# Patient Record
Sex: Male | Born: 1995 | Race: White | Hispanic: No | Smoking: Never smoker
Health system: Southern US, Community
[De-identification: ages and names within clinical notes are randomized; demographics above are authoritative.]

## PROBLEM LIST (undated history)

## (undated) DIAGNOSIS — A6000 Herpesviral infection of urogenital system, unspecified: Secondary | ICD-10-CM

## (undated) HISTORY — DX: Herpesviral infection of urogenital system, unspecified: A60.00

---

## 2017-02-07 ENCOUNTER — Encounter (HOSPITAL_COMMUNITY): Payer: Self-pay | Admitting: Emergency Medicine

## 2017-02-07 ENCOUNTER — Ambulatory Visit (HOSPITAL_COMMUNITY)
Admission: EM | Admit: 2017-02-07 | Discharge: 2017-02-07 | Disposition: A | Discharge status: Home / Self Care | Payer: No Typology Code available for payment source | Attending: Internal Medicine | Admitting: Internal Medicine

## 2017-02-07 DIAGNOSIS — R509 Fever, unspecified: Secondary | ICD-10-CM

## 2017-02-07 DIAGNOSIS — J029 Acute pharyngitis, unspecified: Secondary | ICD-10-CM

## 2017-02-07 LAB — POCT RAPID STREP A: Streptococcus, Group A Screen (Direct): NEGATIVE

## 2017-02-07 MED ORDER — ACETAMINOPHEN 325 MG PO TABS
650.0000 mg | ORAL_TABLET | Freq: Once | ORAL | Status: AC
  Administered 2017-02-07: 650 mg via ORAL

## 2017-02-07 MED ORDER — ACETAMINOPHEN 325 MG PO TABS
ORAL_TABLET | ORAL | Status: AC
  Filled 2017-02-07: qty 2

## 2017-02-07 MED ORDER — LIDOCAINE HCL (PF) 1 % IJ SOLN
INTRAMUSCULAR | Status: AC
  Filled 2017-02-07: qty 2

## 2017-02-07 MED ORDER — METHYLPREDNISOLONE SODIUM SUCC 125 MG IJ SOLR
125.0000 mg | Freq: Once | INTRAMUSCULAR | Status: AC
  Administered 2017-02-07: 125 mg via INTRAMUSCULAR

## 2017-02-07 MED ORDER — CEFTRIAXONE SODIUM 1 G IJ SOLR
INTRAMUSCULAR | Status: AC
  Filled 2017-02-07: qty 10

## 2017-02-07 MED ORDER — LIDOCAINE VISCOUS 2 % MT SOLN: 10.0000 mL | OROMUCOSAL | 0 refills | Status: DC | PRN

## 2017-02-07 MED ORDER — CEFTRIAXONE SODIUM 1 G IJ SOLR
1.0000 g | Freq: Once | INTRAMUSCULAR | Status: AC
  Administered 2017-02-07: 1 g via INTRAMUSCULAR

## 2017-02-07 MED ORDER — AMOXICILLIN-POT CLAVULANATE 875-125 MG PO TABS: 1.0000 | ORAL_TABLET | Freq: Two times a day (BID) | ORAL | 0 refills | Status: DC

## 2017-02-07 MED ORDER — METHYLPREDNISOLONE SODIUM SUCC 125 MG IJ SOLR
INTRAMUSCULAR | Status: AC
  Filled 2017-02-07: qty 2

## 2017-02-07 NOTE — Discharge Instructions (Signed)
If having difficulty swallowing secretions or if not better then go to ED.

## 2017-02-07 NOTE — ED Notes (Signed)
Patient requesting to wait a few minutes prior to leaving, provided gatorade

## 2017-02-07 NOTE — ED Triage Notes (Signed)
The patient presented to the [facility] with a complaint of a sore throat x 4 days. 

## 2017-02-07 NOTE — ED Provider Notes (Signed)
  [facility]   [id number] 02/07/17 Arrival Time: 1442  ASSESSMENT & PLAN:  1. Pharyngitis, unspecified etiology   2. Sore throat     Meds ordered this encounter  Medications  . acetaminophen (TYLENOL) tablet 650 mg  . cefTRIAXone (ROCEPHIN) injection 1 g  . methylPREDNISolone sodium succinate (SOLU-MEDROL) 125 mg/2 mL injection 125 mg  . amoxicillin-clavulanate (AUGMENTIN) 875-125 MG tablet    Sig: Take 1 tablet by mouth 2 (two) times daily.    Dispense:  20 tablet    Refill:  0    Order Specific Question:   Supervising Provider    Answer:   Eustace Moore [[id number]]  . lidocaine (XYLOCAINE) 2 % solution    Sig: Use as directed 10 mLs in the mouth or throat as needed for mouth pain.    Dispense:  100 mL    Refill:  0    Order Specific Question:   Supervising Provider    Answer:   Eustace Moore [[id number]]    Reviewed expectations re: course of current medical issues. Questions answered. Outlined signs and symptoms indicating need for more acute intervention. Patient verbalized understanding. After Visit Summary given.  Explained to patient if he is not getting any better or worsens then go straight to ED. Explained if unable to swallow secretions or develops any stridor then go to ED. SUBJECTIVE:  Robert Whitney is a 21 y.o. male who presents with complaint of sore throat and fever for last 4 days.    ROS: As per HPI.   OBJECTIVE:  Vitals:   02/07/17 1500  BP: 111/62  Pulse: (!) 129  Resp: 18  Temp: (!) 100.7 F (38.2 C)  TempSrc: Oral  SpO2: 96%     General appearance: alert; no distress Eyes: PERRLA; EOMI; conjunctiva normal HENT: normocephalic; atraumatic; TMs normal; nasal mucosa normal; Erythematous tonsils that are touching and with exudates. Swollen and erythematous uvula. Neck: LAD anterior Lungs: clear to auscultation bilaterally Heart: regular rate and rhythm Neurologic: normal gait; normal symmetric reflexes Psychological: alert and  cooperative; normal mood and affect  History reviewed. No pertinent past medical history.   has no past medical history on file.  Results for orders placed or performed during the hospital encounter of 02/07/17  POCT rapid strep A [facility])  Result Value Ref Range   Streptococcus, Group A Screen (Direct) NEGATIVE NEGATIVE    Labs Reviewed  POCT RAPID STREP A    Imaging: No results found.  Allergies  Allergen Reactions  . Nyquil Multi-Symptom [Pseudoeph-Doxylamine-Dm-Apap] Other (See Comments)    weakness    History reviewed. No pertinent family history. History reviewed. No pertinent surgical history.       Deatra Canter, [place] 02/07/17 [phone]

## 2017-02-08 ENCOUNTER — Encounter (HOSPITAL_COMMUNITY): Payer: Self-pay | Admitting: Emergency Medicine

## 2017-02-08 ENCOUNTER — Emergency Department (HOSPITAL_COMMUNITY): Payer: No Typology Code available for payment source

## 2017-02-08 ENCOUNTER — Emergency Department (HOSPITAL_COMMUNITY)
Admission: EM | Admit: 2017-02-08 | Discharge: 2017-02-08 | Disposition: A | Discharge status: Home / Self Care | Payer: No Typology Code available for payment source | Attending: Emergency Medicine | Admitting: Emergency Medicine

## 2017-02-08 DIAGNOSIS — J028 Acute pharyngitis due to other specified organisms: Secondary | ICD-10-CM | POA: Insufficient documentation

## 2017-02-08 DIAGNOSIS — R509 Fever, unspecified: Secondary | ICD-10-CM | POA: Insufficient documentation

## 2017-02-08 DIAGNOSIS — B9689 Other specified bacterial agents as the cause of diseases classified elsewhere: Secondary | ICD-10-CM | POA: Insufficient documentation

## 2017-02-08 DIAGNOSIS — J029 Acute pharyngitis, unspecified: Secondary | ICD-10-CM | POA: Diagnosis present

## 2017-02-08 LAB — CBC WITH DIFFERENTIAL/PLATELET
BASOS ABS: 0 10*3/uL (ref 0.0–0.1)
Basophils Relative: 0 %
EOS PCT: 0 %
Eosinophils Absolute: 0 10*3/uL (ref 0.0–0.7)
HCT: 44.8 % (ref 39.0–52.0)
Hemoglobin: 15.3 g/dL (ref 13.0–17.0)
LYMPHS ABS: 1.1 10*3/uL (ref 0.7–4.0)
Lymphocytes Relative: 9 %
MCH: 29.6 pg (ref 26.0–34.0)
MCHC: 34.2 g/dL (ref 30.0–36.0)
MCV: 86.7 fL (ref 78.0–100.0)
MONO ABS: 2 10*3/uL — AB (ref 0.1–1.0)
Monocytes Relative: 17 %
NEUTROS PCT: 74 %
Neutro Abs: 8.8 10*3/uL — ABNORMAL HIGH (ref 1.7–7.7)
PLATELETS: 212 10*3/uL (ref 150–400)
RBC: 5.17 MIL/uL (ref 4.22–5.81)
RDW: 12.5 % (ref 11.5–15.5)
WBC: 11.9 10*3/uL — AB (ref 4.0–10.5)

## 2017-02-08 LAB — COMPREHENSIVE METABOLIC PANEL
ALBUMIN: 3.8 g/dL (ref 3.5–5.0)
ALT: 27 U/L (ref 17–63)
AST: 30 U/L (ref 15–41)
Alkaline Phosphatase: 34 U/L — ABNORMAL LOW (ref 38–126)
Anion gap: 13 (ref 5–15)
BUN: 13 mg/dL (ref 6–20)
CALCIUM: 9 mg/dL (ref 8.9–10.3)
CHLORIDE: 95 mmol/L — AB (ref 101–111)
CO2: 27 mmol/L (ref 22–32)
Creatinine, Ser: 0.83 mg/dL (ref 0.61–1.24)
Glucose, Bld: 139 mg/dL — ABNORMAL HIGH (ref 65–99)
Potassium: 3.5 mmol/L (ref 3.5–5.1)
SODIUM: 135 mmol/L (ref 135–145)
Total Bilirubin: 1 mg/dL (ref 0.3–1.2)
Total Protein: 7.7 g/dL (ref 6.5–8.1)

## 2017-02-08 LAB — I-STAT CG4 LACTIC ACID, ED: LACTIC ACID, VENOUS: 1.89 mmol/L (ref 0.5–1.9)

## 2017-02-08 MED ORDER — AZITHROMYCIN 250 MG PO TABS
1000.0000 mg | ORAL_TABLET | Freq: Once | ORAL | Status: AC
  Administered 2017-02-08: 1000 mg via ORAL
  Filled 2017-02-08: qty 4

## 2017-02-08 MED ORDER — HYDROCODONE-ACETAMINOPHEN 7.5-325 MG/15ML PO SOLN: 15.0000 mL | Freq: Four times a day (QID) | ORAL | 0 refills | Status: AC | PRN

## 2017-02-08 MED ORDER — HYDROCODONE-ACETAMINOPHEN 7.5-325 MG/15ML PO SOLN
15.0000 mL | Freq: Once | ORAL | Status: AC
  Administered 2017-02-08: 15 mL via ORAL
  Filled 2017-02-08: qty 15

## 2017-02-08 MED ORDER — SODIUM CHLORIDE 0.9 % IV BOLUS (SEPSIS)
1000.0000 mL | Freq: Once | INTRAVENOUS | Status: AC
  Administered 2017-02-08: 1000 mL via INTRAVENOUS

## 2017-02-08 MED ORDER — HYDROCODONE-HOMATROPINE 5-1.5 MG/5ML PO SYRP: 5.0000 mL | ORAL_SOLUTION | Freq: Once | ORAL | Status: DC

## 2017-02-08 MED ORDER — ONDANSETRON 4 MG PO TBDP
4.0000 mg | ORAL_TABLET | Freq: Once | ORAL | Status: AC
  Administered 2017-02-08: 4 mg via ORAL

## 2017-02-08 NOTE — ED Triage Notes (Signed)
Sore throat since wed has been to see 2 drs given amox and finished that and yesterday given shot of steriods and rocephin yesterday,  Still having excruciating  Sore throat, pt has hot potato voice

## 2017-02-08 NOTE — ED Provider Notes (Signed)
[facility] Provider Note   CSN: [id number] Arrival date & time: 02/08/17  1108     History   Chief Complaint Chief Complaint  Patient presents with  . Sore Throat  . Fever    HPI Robert Whitney is a 21 y.o. male.  HPI Patient presents to the emergency department with a three-day history of sore throat.  The patient states that he has been seen twice for this sore throat once yesterday at the urgent care, who advised him that if he is not better by today, he needs to come to the emergency department.  Patient states that he was given an antibiotic injection along with steroid injection.  The patient states that they did give him some lidocaine to help numb the throat and they gave him Augmentin as well.  Patient states that he is not having any trouble swallowing other than pain. The patient denies chest pain, shortness of breath, headache,blurred vision, neck pain, fever, cough, weakness, numbness, dizziness, anorexia, edema, abdominal pain, nausea, vomiting, diarrhea, rash, back pain, dysuria, hematemesis, bloody stool, near syncope, or syncope. History reviewed. No pertinent past medical history.  There are no active problems to display for this patient.   History reviewed. No pertinent surgical history.     Home Medications    Prior to Admission medications   Medication Sig Start Date End Date Taking? Authorizing Provider  amoxicillin-clavulanate (AUGMENTIN) 875-125 MG tablet Take 1 tablet by mouth 2 (two) times daily. 02/07/17  Yes Deatra Canter, FNP  lidocaine (XYLOCAINE) 2 % solution Use as directed 10 mLs in the mouth or throat as needed for mouth pain. Patient not taking: Reported on 02/08/2017 02/07/17   Deatra Canter, FNP    Family History No family history on file.  Social History Social History  Substance Use Topics  . Smoking status: Never Smoker  . Smokeless tobacco: Never Used  . Alcohol use No     Allergies   Nyquil multi-symptom  [pseudoeph-doxylamine-dm-apap]   Review of Systems Review of Systems All other systems negative except as documented in the HPI. All pertinent positives and negatives as reviewed in the HPI.   Physical Exam Updated Vital Signs BP 130/82   Pulse (!) 101   Temp 99 F (37.2 C) (Oral)   Resp 18   Ht 6' (1.829 m)   Wt 68 kg (150 lb)   SpO2 97%   BMI 20.34 kg/m   Physical Exam  Constitutional: He is oriented to person, place, and time. He appears well-developed and well-nourished. No distress.  HENT:  Head: Normocephalic and atraumatic.  Mouth/Throat: Uvula is midline. He does not have dentures. No oral lesions. No trismus in the jaw. Normal dentition. No uvula swelling. Oropharyngeal exudate, posterior oropharyngeal edema and posterior oropharyngeal erythema present. No tonsillar abscesses.  Eyes: Pupils are equal, round, and reactive to light.  Neck: Normal range of motion. Neck supple.  Cardiovascular: Normal rate, regular rhythm and normal heart sounds.  Exam reveals no gallop and no friction rub.   No murmur heard. Pulmonary/Chest: Effort normal and breath sounds normal. No respiratory distress. He has no wheezes.  Neurological: He is alert and oriented to person, place, and time. He exhibits normal muscle tone. Coordination normal.  Skin: Skin is warm and dry. Capillary refill takes less than 2 seconds. No rash noted. No erythema.  Psychiatric: He has a normal mood and affect. His behavior is normal.  Nursing note and vitals reviewed.    ED Treatments /  Results  Labs (all labs ordered are listed, but only abnormal results are displayed) Labs Reviewed  COMPREHENSIVE METABOLIC PANEL - Abnormal; Notable for the following:       Result Value   Chloride 95 (*)    Glucose, Bld 139 (*)    Alkaline Phosphatase 34 (*)    All other components within normal limits  CBC WITH DIFFERENTIAL/PLATELET - Abnormal; Notable for the following:    WBC 11.9 (*)    Neutro Abs 8.8 (*)     Monocytes Absolute 2.0 (*)    All other components within normal limits  HIV ANTIBODY (ROUTINE TESTING)  I-STAT CG4 LACTIC ACID, ED  GC/CHLAMYDIA PROBE AMP ([place]) NOT AT [facility]    EKG  EKG Interpretation None       Radiology Dg Chest 2 View  Result Date: 02/08/2017 CLINICAL DATA:  Fever, sore throat EXAM: CHEST  2 VIEW COMPARISON:  None available FINDINGS: The heart size and mediastinal contours are within normal limits. Both lungs are clear. The visualized skeletal structures are unremarkable. Bilateral nipple piercings noted. IMPRESSION: No active cardiopulmonary disease. Electronically Signed   By: Judie Petit.  Shick M.D.   On: 02/08/2017 11:51    Procedures Procedures (including critical care time)  Medications Ordered in ED Medications  azithromycin (ZITHROMAX) tablet 1,000 mg (not administered)  sodium chloride 0.9 % bolus 1,000 mL (0 mLs Intravenous Stopped 02/08/17 1519)     Initial Impression / Assessment and Plan / ED Course  I have reviewed the triage vital signs and the nursing notes.  Pertinent labs & imaging results that were available during my care of the patient were reviewed by me and considered in my medical decision making (see chart for details).    The patient had a recent sexual contact 1 week ago with the symptoms starting 3 days ago.  This could be simply mono.  I will treat for possible STD.  Patient was given antibiotics for typical pharyngitis without relief of his symptoms  Final Clinical Impressions(s) / ED Diagnoses   Final diagnoses:  None    New Prescriptions New Prescriptions   No medications on file     Charlestine Night, Cordelia Poche 02/08/17 1530    Alvira Monday, MD 02/08/17 [phone]

## 2017-02-08 NOTE — Discharge Instructions (Signed)
Return here as needed.  Follow up with the Dr. provided

## 2017-02-09 LAB — GC/CHLAMYDIA PROBE AMP (~~LOC~~) NOT AT ARMC
Chlamydia: NEGATIVE
Neisseria Gonorrhea: POSITIVE — AB

## 2017-02-09 LAB — HIV ANTIBODY (ROUTINE TESTING W REFLEX): HIV Screen 4th Generation wRfx: NONREACTIVE

## 2017-02-10 ENCOUNTER — Emergency Department (HOSPITAL_COMMUNITY): Payer: No Typology Code available for payment source

## 2017-02-10 ENCOUNTER — Emergency Department (HOSPITAL_COMMUNITY)
Admission: EM | Admit: 2017-02-10 | Discharge: 2017-02-10 | Disposition: A | Discharge status: Home / Self Care | Payer: No Typology Code available for payment source | Attending: Emergency Medicine | Admitting: Emergency Medicine

## 2017-02-10 DIAGNOSIS — A545 Gonococcal pharyngitis: Secondary | ICD-10-CM | POA: Insufficient documentation

## 2017-02-10 DIAGNOSIS — J029 Acute pharyngitis, unspecified: Secondary | ICD-10-CM | POA: Diagnosis present

## 2017-02-10 LAB — CBC WITH DIFFERENTIAL/PLATELET
BASOS ABS: 0.1 10*3/uL (ref 0.0–0.1)
Basophils Relative: 1 %
Eosinophils Absolute: 0.1 10*3/uL (ref 0.0–0.7)
Eosinophils Relative: 1 %
HEMATOCRIT: 43.1 % (ref 39.0–52.0)
HEMOGLOBIN: 14.5 g/dL (ref 13.0–17.0)
LYMPHS PCT: 15 %
Lymphs Abs: 1.8 10*3/uL (ref 0.7–4.0)
MCH: 29.4 pg (ref 26.0–34.0)
MCHC: 33.6 g/dL (ref 30.0–36.0)
MCV: 87.2 fL (ref 78.0–100.0)
MONOS PCT: 13 %
Monocytes Absolute: 1.6 10*3/uL — ABNORMAL HIGH (ref 0.1–1.0)
NEUTROS ABS: 8.7 10*3/uL — AB (ref 1.7–7.7)
Neutrophils Relative %: 70 %
Platelets: 297 10*3/uL (ref 150–400)
RBC: 4.94 MIL/uL (ref 4.22–5.81)
RDW: 12.6 % (ref 11.5–15.5)
WBC: 12.3 10*3/uL — ABNORMAL HIGH (ref 4.0–10.5)

## 2017-02-10 LAB — I-STAT CG4 LACTIC ACID, ED: LACTIC ACID, VENOUS: 1.34 mmol/L (ref 0.5–1.9)

## 2017-02-10 LAB — CULTURE, GROUP A STREP (THRC)

## 2017-02-10 LAB — BASIC METABOLIC PANEL
ANION GAP: 13 (ref 5–15)
BUN: 7 mg/dL (ref 6–20)
CHLORIDE: 96 mmol/L — AB (ref 101–111)
CO2: 25 mmol/L (ref 22–32)
Calcium: 8.8 mg/dL — ABNORMAL LOW (ref 8.9–10.3)
Creatinine, Ser: 0.85 mg/dL (ref 0.61–1.24)
GFR calc non Af Amer: >60 mL/min (ref >60)
Glucose, Bld: 92 mg/dL (ref 65–99)
POTASSIUM: 3.6 mmol/L (ref 3.5–5.1)
Sodium: 134 mmol/L — ABNORMAL LOW (ref 135–145)

## 2017-02-10 MED ORDER — CEFIXIME 400 MG PO CAPS: 400.0000 mg | ORAL_CAPSULE | Freq: Every day | ORAL | 0 refills | Status: DC

## 2017-02-10 MED ORDER — CHLORHEXIDINE GLUCONATE 0.12 % MT SOLN: 15.0000 mL | Freq: Two times a day (BID) | OROMUCOSAL | 0 refills | Status: DC

## 2017-02-10 MED ORDER — DEXAMETHASONE SODIUM PHOSPHATE 10 MG/ML IJ SOLN
10.0000 mg | Freq: Once | INTRAMUSCULAR | Status: AC
  Administered 2017-02-10: 10 mg via INTRAVENOUS
  Filled 2017-02-10: qty 1

## 2017-02-10 MED ORDER — ACETAMINOPHEN 325 MG PO TABS
650.0000 mg | ORAL_TABLET | Freq: Once | ORAL | Status: AC
  Administered 2017-02-10: 650 mg via ORAL
  Filled 2017-02-10: qty 2

## 2017-02-10 MED ORDER — TRAMADOL HCL 50 MG PO TABS: 50.0000 mg | ORAL_TABLET | Freq: Four times a day (QID) | ORAL | 0 refills | Status: DC | PRN

## 2017-02-10 MED ORDER — MORPHINE SULFATE (PF) 4 MG/ML IV SOLN
4.0000 mg | Freq: Once | INTRAVENOUS | Status: AC
  Administered 2017-02-10: 4 mg via INTRAVENOUS
  Filled 2017-02-10: qty 1

## 2017-02-10 MED ORDER — IOPAMIDOL (ISOVUE-300) INJECTION 61%
INTRAVENOUS | Status: AC
  Filled 2017-02-10: qty 75

## 2017-02-10 MED ORDER — SODIUM CHLORIDE 0.9 % IV BOLUS (SEPSIS)
1000.0000 mL | Freq: Once | INTRAVENOUS | Status: AC
  Administered 2017-02-10: 1000 mL via INTRAVENOUS

## 2017-02-10 MED ORDER — DEXTROSE 5 % IV SOLN
1.0000 g | Freq: Once | INTRAVENOUS | Status: AC
  Administered 2017-02-10: 1 g via INTRAVENOUS
  Filled 2017-02-10: qty 10

## 2017-02-10 MED ORDER — IOPAMIDOL (ISOVUE-300) INJECTION 61%
75.0000 mL | Freq: Once | INTRAVENOUS | Status: AC | PRN
Start: 2017-02-10 — End: 2017-02-10
  Administered 2017-02-10: 75 mL via INTRAVENOUS

## 2017-02-10 NOTE — ED Triage Notes (Signed)
Pt was just seen on Monday for sore throat and dx with GC of throat , pt given meds but returns for cont sore throat and fever

## 2017-02-10 NOTE — ED Notes (Signed)
Patient transported to CT 

## 2017-02-10 NOTE — ED Provider Notes (Signed)
[facility] Provider Note   CSN: [id number] Arrival date & time: 02/10/17  1407     History   Chief Complaint Chief Complaint  Patient presents with  . Sore Throat    HPI Robert Whitney is a 21 y.o. male.  HPI   21 year old male presenting for evaluation of sore throat. Patient has been having persistent sore throat ongoing for the past 5 days. He was initially seen at the urgent care 4 days ago. He received an antibiotic injection and a steroid injection. He also was prescribed Augmentin. He was given lidocaine to help numb the throat. He was seen at the ER 2 days ago. He expressed concern for STI. An STD panel was obtained, patient was given Rocephin and Zithromax. Throat culture positive for gonorrhea. Strep test is negative and throat culture did not grow anything abnormal. Despite receiving the treatment, patient reported not feeling any better. Still endorse fever, chills, trouble swallowing, difficulty talking. No report of chest pain, shortness of breath, abdominal pain or rash. Denies any penile discharge. Patient is sexually active with a new same sex partner.  No past medical history on file.  There are no active problems to display for this patient.   No past surgical history on file.     Home Medications    Prior to Admission medications   Medication Sig Start Date End Date Taking? Authorizing Provider  amoxicillin-clavulanate (AUGMENTIN) 875-125 MG tablet Take 1 tablet by mouth 2 (two) times daily. 02/07/17   Deatra Canter, FNP  HYDROcodone-acetaminophen (HYCET) 7.5-325 mg/15 ml solution Take 15 mLs by mouth 4 (four) times daily as needed for moderate pain. 02/08/17 02/08/18  Lawyer, Cristal Deer, PA-C  lidocaine (XYLOCAINE) 2 % solution Use as directed 10 mLs in the mouth or throat as needed for mouth pain. Patient not taking: Reported on 02/08/2017 02/07/17   Deatra Canter, FNP    Family History No family history on file.  Social History Social  History  Substance Use Topics  . Smoking status: Never Smoker  . Smokeless tobacco: Never Used  . Alcohol use No     Allergies   Nyquil multi-symptom [pseudoeph-doxylamine-dm-apap]   Review of Systems Review of Systems  All other systems reviewed and are negative.    Physical Exam Updated Vital Signs BP (!) 130/93 (BP Location: Left Arm)   Pulse (!) 126   Temp 100.2 F (37.9 C) (Oral)   Resp (!) 22   SpO2 100%   Physical Exam  Constitutional: He appears well-developed and well-nourished. No distress.  Nontoxic in appearance, slightly muffled voice  HENT:  Head: Atraumatic.  Ears: Normal TMs bilaterally Nose: Normal nares Throat: Uvula is midline, bilateral tonsillar enlargement with exudates but no obvious abscess and no trismus noted  Eyes: Conjunctivae are normal.  Neck: Normal range of motion. Neck supple.  No nuchal rigidity  Cardiovascular:  Tachycardia without murmurs or gallops  Pulmonary/Chest: Effort normal and breath sounds normal.  Abdominal: Soft.  No splenomegaly  Lymphadenopathy:    He has cervical adenopathy.  Neurological: He is alert.  Skin: No rash noted.  Psychiatric: He has a normal mood and affect.  Nursing note and vitals reviewed.    ED Treatments / Results  Labs (all labs ordered are listed, but only abnormal results are displayed) Labs Reviewed  CBC WITH DIFFERENTIAL/PLATELET - Abnormal; Notable for the following:       Result Value   WBC 12.3 (*)    Neutro Abs 8.7 (*)  Monocytes Absolute 1.6 (*)    All other components within normal limits  BASIC METABOLIC PANEL - Abnormal; Notable for the following:    Sodium 134 (*)    Chloride 96 (*)    Calcium 8.8 (*)    All other components within normal limits  I-STAT CG4 LACTIC ACID, ED  GC/CHLAMYDIA PROBE AMP ([place]) NOT AT [facility]    EKG  EKG Interpretation None       Radiology Ct Soft Tissue Neck W Contrast  Result Date: 02/10/2017 CLINICAL DATA:  21 y/o  M;  throat pain, rule out abscess. EXAM: CT NECK WITH CONTRAST TECHNIQUE: Multidetector CT imaging of the neck was performed using the standard protocol following the bolus administration of intravenous contrast. CONTRAST:  89mL ISOVUE-300 IOPAMIDOL (ISOVUE-300) INJECTION 61% COMPARISON:  None. FINDINGS: Pharynx and larynx: Diffuse mucosal thickening of the oral and hypopharynx as well as severe enlargement under the adenoid and palatine tonsils compatible with acute pharyngitis. Striated appearance of the pharyngeal tonsils compatible with severe edema. There is a small focus of air in the right palatine tonsil (series 4, image 34) indicating phlegmon/early abscess. No discrete rim enhancing collection is identified at this time. No airway compromise. Salivary glands: No inflammation, mass, or stone. Thyroid: Normal. Lymph nodes: Bulky bilateral upper cervical lymphadenopathy without necrosis. Vascular: Negative. Limited intracranial: Negative. Visualized orbits: Negative. Mastoids and visualized paranasal sinuses: Clear. Skeleton: No acute or aggressive process. Upper chest: Negative. Other: None. IMPRESSION: 1. Findings of acute pharyngitis with severe tonsillar enlargement and edema. Small focus of air in the right palatine tonsil indicates phlegmon/early abscess formation, however, no discrete rim enhancing collection is present at this time. No extension of inflammatory changes into deep cervical spaces or retropharyngeal space. 2. Bilateral upper cervical lymphadenopathy is likely reactive. No lymph node necrosis. Electronically Signed   By: Mitzi Hansen M.D.   On: 02/10/2017 20:17    Procedures Procedures (including critical care time)  Medications Ordered in ED Medications  morphine 4 MG/ML injection 4 mg (4 mg Intravenous Given 02/10/17 1807)  dexamethasone (DECADRON) injection 10 mg (10 mg Intravenous Given 02/10/17 1807)  cefTRIAXone (ROCEPHIN) 1 g in dextrose 5 % 50 mL IVPB (0 g  Intravenous Stopped 02/10/17 1845)  sodium chloride 0.9 % bolus 1,000 mL (0 mLs Intravenous Stopped 02/10/17 1950)  acetaminophen (TYLENOL) tablet 650 mg (650 mg Oral Given 02/10/17 1806)  iopamidol (ISOVUE-300) 61 % injection 75 mL (75 mLs Intravenous Contrast Given 02/10/17 1951)     Initial Impression / Assessment and Plan / ED Course  I have reviewed the triage vital signs and the nursing notes.  Pertinent labs & imaging results that were available during my care of the patient were reviewed by me and considered in my medical decision making (see chart for details).     BP 120/79   Pulse 74   Temp 100.2 F (37.9 C) (Oral)   Resp (!) 22   SpO2 98%    Final Clinical Impressions(s) / ED Diagnoses   Final diagnoses:  Gonococcal pharyngitis    New Prescriptions New Prescriptions   CEFIXIME (SUPRAX) 400 MG CAPS CAPSULE    Take 1 capsule (400 mg total) by mouth daily.   CHLORHEXIDINE (PERIDEX) 0.12 % SOLUTION    Use as directed 15 mLs in the mouth or throat 2 (two) times daily.   TRAMADOL (ULTRAM) 50 MG TABLET    Take 1 tablet (50 mg total) by mouth every 6 (six) hours as needed.  6:04 PM Patient recently diagnosed with gonococcal pharyngitis he with persistent throat irritation. He does have moderate tonsillar enlargement and exudates on exam bilaterally. No obvious peritonsillar abscess appreciated. He does have a low-grade temperature, and is tachycardic. Mildly elevated white count of 12.3 but normal lactic acid. Patient given IV fluids, Rocephin, Decadron, and we'll obtain a soft tissue CT to rule out potential abscess. Care discussed with Dr. Fayrene Fearing  9:37 PM Mixed soft tissue CT scan demonstrated finding of acute pharyngitis with severe tonsillar enlargement and edema. Small focus of a in the right pontine tonsil indicate phlegmon or maybe early abscess formation however no discrete ring enhancing collection present at this time.  Given this finding, patient will be discharged  home with Suprax for 10 days, pain medication as needed.  ENT referral given. Return precautions discussed. I will obtain a gonorrhea throat culture to also test for possible drug resistance. In order to decrease risk of narcotic abuse. Pt's record were checked using the [place] Controlled Substance database.     Fayrene Helper, PA-C 02/10/17 2139    Rolland Porter, MD 02/23/17 [phone]

## 2017-02-10 NOTE — Discharge Instructions (Signed)
Take Suprax for the next 10 days.  Take tramadol as needed for pain.  Follow up with ENT specialist if no improvement.

## 2017-02-11 LAB — GC/CHLAMYDIA PROBE AMP (~~LOC~~) NOT AT ARMC
Chlamydia: NEGATIVE
Neisseria Gonorrhea: NEGATIVE

## 2018-11-27 DIAGNOSIS — A6000 Herpesviral infection of urogenital system, unspecified: Secondary | ICD-10-CM | POA: Insufficient documentation

## 2019-03-28 IMAGING — DX DG CHEST 2V
2 series · 2 of 2 positions shown · non-contrast
Comparison: None available

CLINICAL DATA: Fever, sore throat

EXAM:
CHEST  2 VIEW

[chest lat]
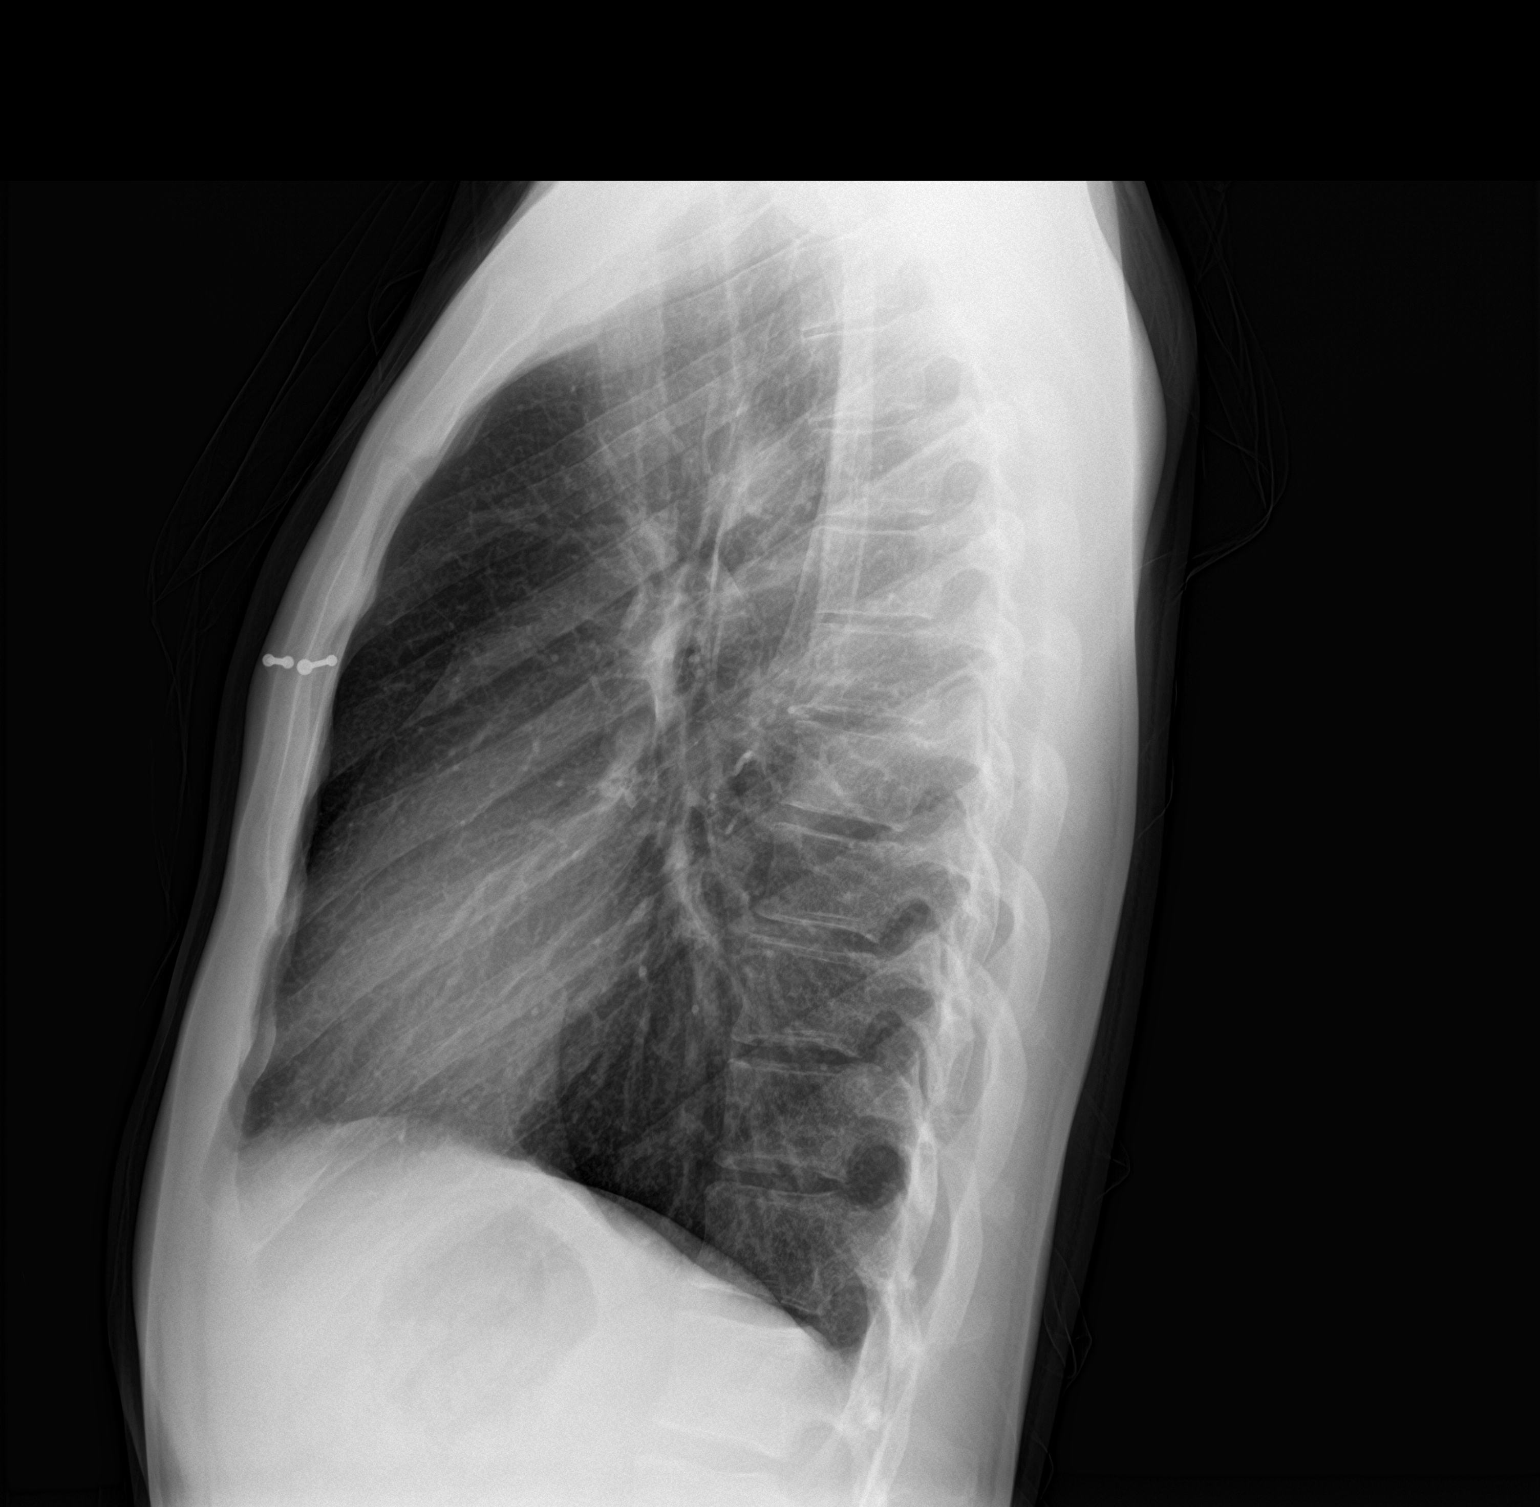

[chest pa]
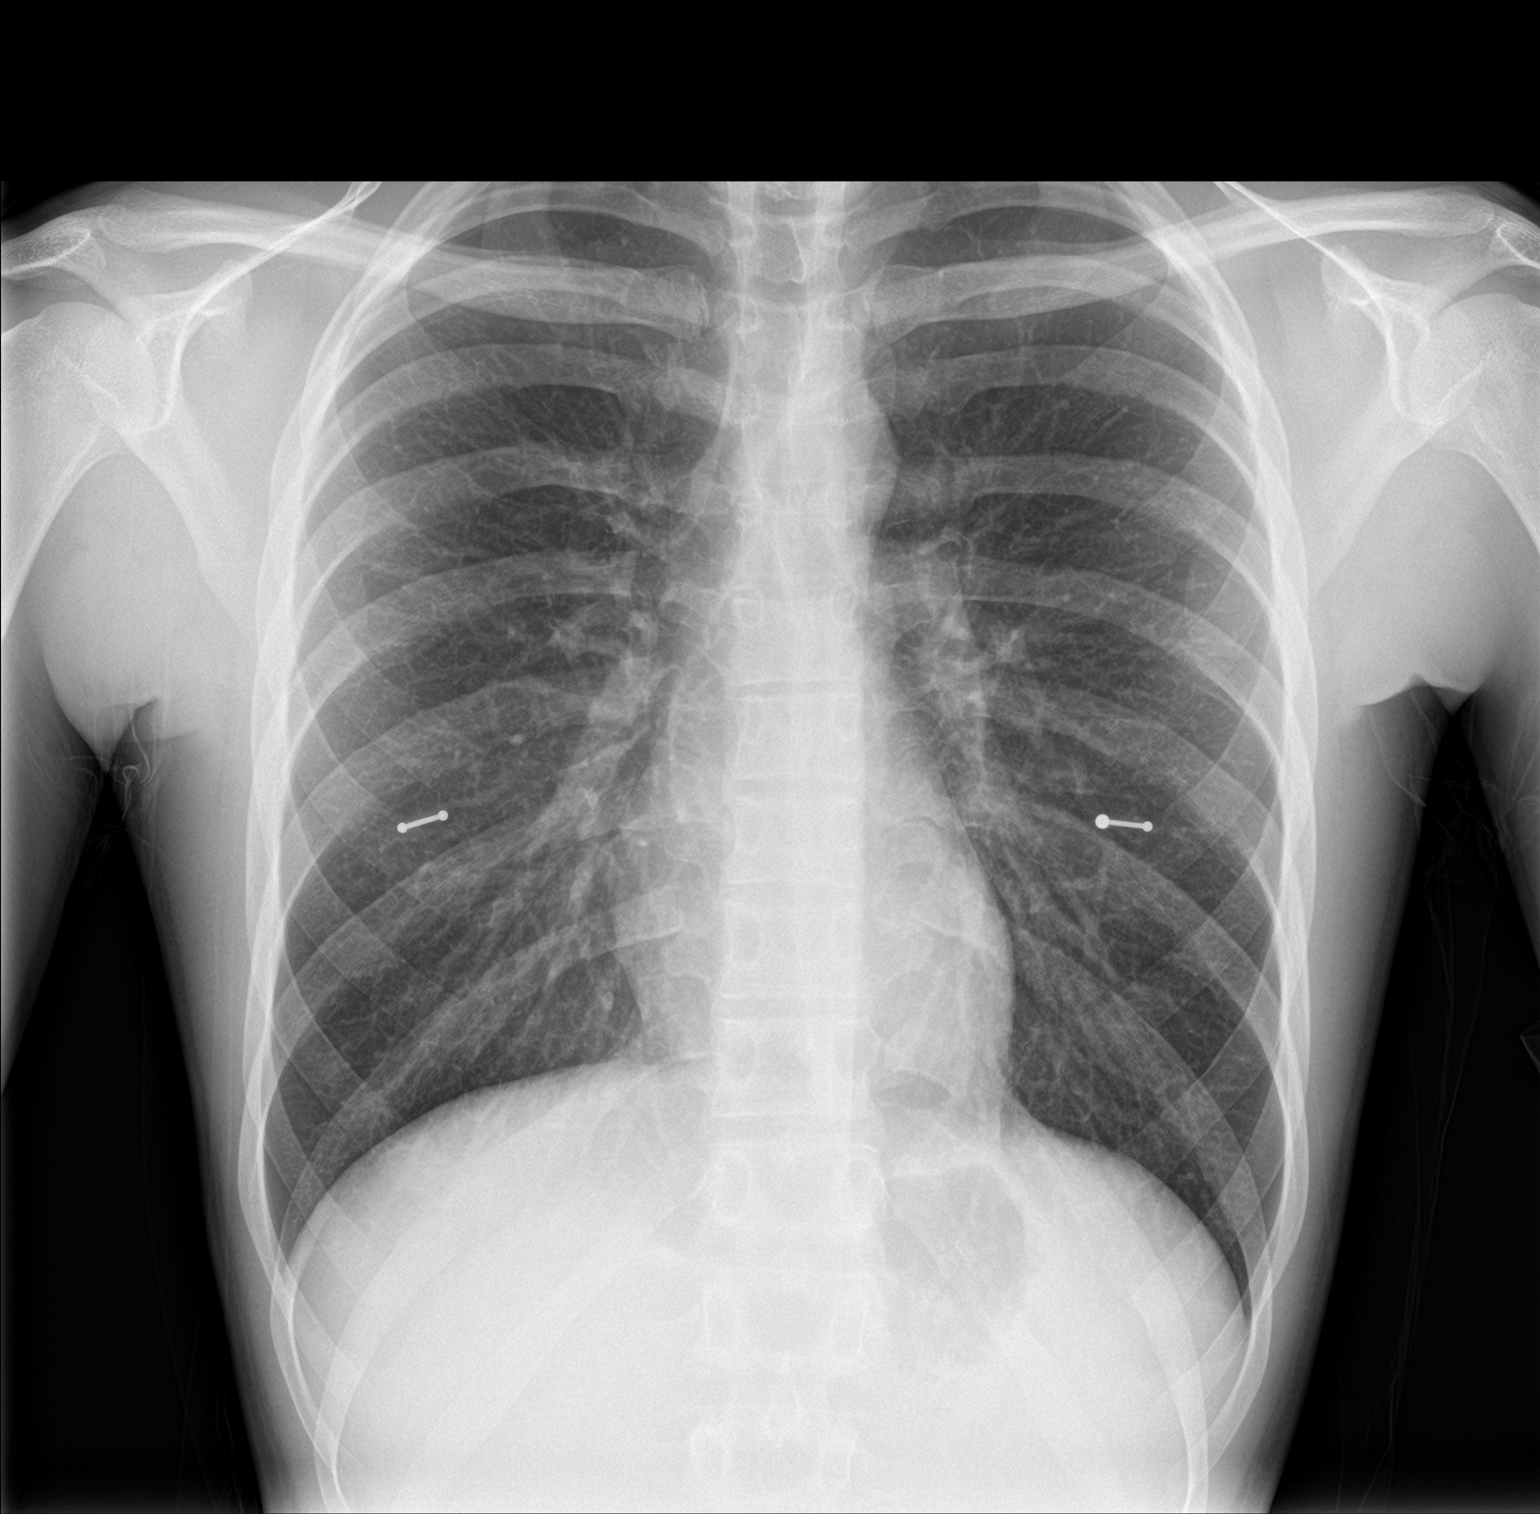

[2 of 2 positions shown; findings below may reference images not displayed]

FINDINGS: The heart size and mediastinal contours are within normal limits.
Both lungs are clear. The visualized skeletal structures are
unremarkable. Bilateral nipple piercings noted.
IMPRESSION: No active cardiopulmonary disease.

## 2022-01-26 ENCOUNTER — Ambulatory Visit: Payer: 59 | Admitting: Family Medicine

## 2022-01-26 ENCOUNTER — Encounter: Payer: Self-pay | Admitting: Family Medicine

## 2022-01-26 DIAGNOSIS — A601 Herpesviral infection of perianal skin and rectum: Secondary | ICD-10-CM

## 2022-01-26 MED ORDER — VALACYCLOVIR HCL 1 G PO TABS: 1000.0000 mg | ORAL_TABLET | Freq: Every day | ORAL | 0 refills | Status: DC

## 2022-01-26 NOTE — Patient Instructions (Signed)
It was great to meet you today!  Take valacyclovir once daily for suppression. If an outbreak occurs, take two tablets for one day, then continue with daily dosing.  Follow up in one year for annual visit or sooner if needed. Let me know how else I can help!  Janeal Holmes, MD [facility]

## 2022-01-26 NOTE — Assessment & Plan Note (Signed)
Given increased number of outbreaks over last year, discussed starting daily suppressive therapy. Will prescribe valacyclovir 1g daily. Instructed to take BID once when breakthroughs occur. Will reassess efficacy in 3 months.

## 2022-01-26 NOTE — Progress Notes (Signed)
    SUBJECTIVE:   CHIEF COMPLAINT / HPI: new patient visit, genital herpes  Genital herpes Diagnosed in 2020. Has taken valacyclovir with improvement in the past. Is sexually monogamous with one male partner who also has genital herpes. States he has had 8 outbreaks over last year. Lesions are around his anus and painful like hemorrhoids. They last for a couple of days and then resolve with medication. He is wondering if a daily medication could help prevent the increased number of episodes.  PERTINENT  PMH / PSH: Gonococcal pharyngitis 2018, genital herpes 2020 in PMH. No surgical history. Family history pertinent for thyroid disease. Social history with occasional alcohol/marijuana use and as per HPI.  OBJECTIVE:   BP 118/76   Pulse (!) 103   Ht 6' (1.829 m)   Wt 169 lb (76.7 kg)   SpO2 99%   BMI 22.92 kg/m   General: Alert and oriented, in NAD HEENT: NCAT, EOM grossly normal, midline nasal septum Cardiac: Mildly tachycardic Respiratory: Breathing and speaking comfortably on RA Abdominal: Nondistended Extremities: Moves all extremities grossly equally Neurological: No gross focal deficit Psychiatric: Appropriate mood and affect  ASSESSMENT/PLAN:   Genital herpes Given increased number of outbreaks over last year, discussed starting daily suppressive therapy. Will prescribe valacyclovir 1g daily. Instructed to take BID once when breakthroughs occur. Will reassess efficacy in 3 months.   Health maintenance Overall healthy male. Will discuss COVID/flu vaccinations and HCV screening for all patients at next visit.  Janeal Holmes, MD [facility]

## 2022-05-07 ENCOUNTER — Other Ambulatory Visit: Payer: Self-pay | Admitting: Family Medicine

## 2022-06-22 ENCOUNTER — Ambulatory Visit: Payer: 59 | Admitting: Student

## 2022-06-22 VITALS — BP 126/89 | HR 72 | Wt 176.5 lb

## 2022-06-22 DIAGNOSIS — S39012A Strain of muscle, fascia and tendon of lower back, initial encounter: Secondary | ICD-10-CM | POA: Diagnosis not present

## 2022-06-22 MED ORDER — KETOROLAC TROMETHAMINE 30 MG/ML IJ SOLN
30.0000 mg | Freq: Once | INTRAMUSCULAR | Status: AC
  Administered 2022-06-22: 30 mg via INTRAMUSCULAR

## 2022-06-22 MED ORDER — NAPROXEN 500 MG PO TABS: 500.0000 mg | ORAL_TABLET | Freq: Two times a day (BID) | ORAL | 0 refills | Status: AC

## 2022-06-22 NOTE — Assessment & Plan Note (Signed)
Patient given Toradol injection in office today.  Will send naproxen course outpatient.  Discussed stretching and potential for physical therapy if not improving.  Patient amenable to this -Naproxen 500 twice daily with meals for 5 days -Doughnut seat supplement discussed to help with sacral pain while at work sitting -Consider PT referral if not improving

## 2022-06-22 NOTE — Progress Notes (Signed)
    SUBJECTIVE:   CHIEF COMPLAINT / HPI: Back pain  Back pain for the past 3 weeks.  He says the pain is less at his lower back as well as the bottom of his spine near his sacrum.  He said it is not all the time and that it is a dull pain.  He says that he notices it most when twisting or bending over or when sitting for long periods of time.  He has been using bayer back and body 2 pills twice a day.  He has been icing and using heat on it as well.  The pain comes and goes. Was on couch all day yesterday due to significant pain.  Being at work and sitting down at desk makes it worse.  He has been stretching a lot which has been helpful for his pain but only last for about an hour.  He denies any saddle anesthesia, leg weakness, urinary or bowel incontinence. No shocks going down legs.   PERTINENT  PMH / PSH: History of genital herpes on suppressive therapy  OBJECTIVE:   BP 126/89   Pulse 72   Wt 176 lb 8 oz (80.1 kg)   SpO2 100%   BMI 23.94 kg/m   General: Well appearing, NAD, awake, alert, responsive to questions Back No gross deformity, scoliosis. No TTP currently.  No midline or bony TTP. Mild coccydynia Strength LEs 5/5 all muscle groups-some limitation with hip flexion due to pain Sensation intact to light touch bilaterally. No gross knee abnormalities No hip tenderness Mild pain with internal and external rotation of hip in back  ASSESSMENT/PLAN:   Lumbar strain, initial encounter Patient given Toradol injection in office today.  Will send naproxen course outpatient.  Discussed stretching and potential for physical therapy if not improving.  Patient amenable to this -Naproxen 500 twice daily with meals for 5 days -Doughnut seat supplement discussed to help with sacral pain while at work sitting -Consider PT referral if not improving   Gerrit Heck, MD [place]

## 2022-06-22 NOTE — Patient Instructions (Signed)
I have ordered a naproxen course for you to take twice a day for 5 days Please let me know if you would like me to put in a physical therapy referral as well  Low back pain is very common. About 1 in 5 people have back pain. The cause of low back pain is rarely dangerous. The pain often gets better over time. About half of people with a sudden onset of back pain feel better in just 2 weeks. About 8 in 10 people feel better by 6 weeks.  CAUSES Some common causes of back pain include: Strain of the muscles or ligaments supporting the spine. (Most likely in your case) Wear and tear (degeneration) of the spinal discs.  Arthritis.  Direct injury to the back.  HOME CARE INSTRUCTIONS For many people, back pain returns. Since low back pain is rarely dangerous, it is often a condition that people can learn to manage on their own.  Remain active. It is stressful on the back to sit or stand in one place. Do not sit, drive, or stand in one place for more than 30 minutes at a time. Take short walks on level surfaces as soon as pain allows. Try to increase the length of time you walk each day.  Do not stay in bed. Resting more than 1 or 2 days can delay your recovery.  Do not avoid exercise or work. Your body is made to move. It is not dangerous to be active, even though your back may hurt. Your back will likely heal faster if you return to being active before your pain is gone.  Pay attention to your body when you  bend and lift. Many people have less discomfort when lifting if they bend their knees, keep the load close to their bodies, and avoid twisting. Often, the most comfortable positions are those that put less stress on your recovering back.  Find a comfortable position to sleep. Use a firm mattress and lie on your side with your knees slightly bent. If you lie on your back, put a pillow under your knees.  Only take over-the-counter or prescription medicines as directed by your caregiver. Over-the-counter  medicines to reduce pain and inflammation are often the most helpful. Your caregiver may prescribe muscle relaxant drugs. These medicines help dull your pain so you can more quickly return to your normal activities and healthy exercise.  Put ice on the injured area.  Put ice in a plastic bag.  Place a towel between your skin and the bag.  Leave the ice on for 15 to 20 minutes, 3 to 4 times a day for the first 2 to 3 days. After that, ice and heat may be alternated to reduce pain and spasms.  Ask your caregiver about trying back exercises and gentle massage. This may be of some benefit.  Avoid feeling anxious or stressed. Stress increases muscle tension and can worsen back pain. It is important to recognize when you are anxious or stressed and learn ways to manage it. Exercise is a great option.  SEEK IMMEDIATE MEDICAL CARE IF:  You have pain that radiates from your back into your legs.  You develop new bowel or bladder control problems.  You have unusual weakness or numbness in your arms or legs.  You develop nausea or vomiting.  You develop abdominal pain.  You feel faint.

## 2022-07-13 ENCOUNTER — Ambulatory Visit (INDEPENDENT_AMBULATORY_CARE_PROVIDER_SITE_OTHER): Payer: 59 | Admitting: Family Medicine

## 2022-07-13 VITALS — BP 108/72 | HR 79 | Wt 175.6 lb

## 2022-07-13 DIAGNOSIS — A601 Herpesviral infection of perianal skin and rectum: Secondary | ICD-10-CM

## 2022-07-13 DIAGNOSIS — S39012D Strain of muscle, fascia and tendon of lower back, subsequent encounter: Secondary | ICD-10-CM

## 2022-07-13 MED ORDER — CYCLOBENZAPRINE HCL 5 MG PO TABS: 5.0000 mg | ORAL_TABLET | Freq: Three times a day (TID) | ORAL | 0 refills | Status: AC | PRN

## 2022-07-13 NOTE — Patient Instructions (Addendum)
It was great to see you today! Here's what we talked about:  I believe the current pain you are experiencing is due to a back muscle strain.  I am happy to hear it is improving, but we will continue ibuprofen 600 mg every 8 hours and Flexeril 5 mg every 8 hours for the next 5 days.  Please also try the stretches and steps and the back book that I have given you.  If the pain still persist at this time, let me know, and I will place referral for physical therapy.  Please let me know if you have any other questions.  Dr. Marcha Dutton

## 2022-07-13 NOTE — Assessment & Plan Note (Signed)
Improving.  History and exam most consistent with musculoskeletal etiology.  No concern for cauda equina or infectious etiology at this time given lack of neurological changes and infectious symptoms, respectively.  Dribbling after urinating in a sexually active male his age could be consistent with prostatitis; however, reproduction of pain on exam and history more concerning for musculoskeletal etiology.  I am reassured that his symptoms overall improved today, though will recommend ibuprofen 600 mg every 8 hours for anti-inflammatory effects as well as Flexeril 5 mg every 8 hours, both for a total of 5 days.  I advised him to contact me if this does not improve the pain, I will send in a PT referral at that time.

## 2022-07-13 NOTE — Assessment & Plan Note (Addendum)
Experiences outbreaks nearly monthly.  Has not been taking 1 g twice daily for outbreaks.  Advised to take this dose when outbreak occurs, but would also like to observe recurrent lesion around rectum given history of copious bleeding.  Patient to make appointment at next outbreak for further evaluation; plan for labs at that time.

## 2022-07-13 NOTE — Progress Notes (Signed)
    SUBJECTIVE:   CHIEF COMPLAINT / HPI:   Lumbar sprain follow up Robert Whitney reports some improvement in his lower back pain since last being seen on 1/8.  However, for the majority of January, his pain persisted.  He did experience relief with the Toradol injection and naproxen at the time, but he notices his pain the most now whenever he moves in certain ways.  Whenever he twists his torso, especially in bed, he experiences sharp pain.  Whenever he is at rest, the pain is mostly 3 out of 10.  He remains unsure of what could have caused this and denies any trauma, though he was packing a lot of presents and moving a lot over Christmas time, he feels that could be part of it.  He denies any sensation changes, fevers, bowel changes, or urinary changes, however he does feel as though sometimes, he dribbles after he pees.  He is monogamous with his male partner.  He notes that he does have fairly regular outbreaks of his HSV as well, and he wonders this could be a problem.  He notes a recurrent lesion at the top of his rectum towards the sacrum that does bleed a lot when he goes to the bathroom.  However, the lesion is not active at the moment.  OBJECTIVE:   BP 108/72   Pulse 79   Wt 175 lb 9.6 oz (79.7 kg)   SpO2 98%   BMI 23.82 kg/m   General: Alert and oriented, in NAD Skin: Warm, dry, and intact without lesions HEENT: NCAT, EOM grossly normal, midline nasal septum Cardiac: Regular rate Respiratory: Breathing and speaking comfortably on RA Abdominal: Nondistended Neurological/MSK: Strength and sensation 5 out of 5 in bilateral lower extremities, pain reproduced in the bilateral lower back with bilateral leg raise, no tenderness to palpation over entire spine or paraspinal muscles, pain in lower back reproduced with bilateral twisting of torso, ambulates well without difficulty Psychiatric: Appropriate mood and affect  ASSESSMENT/PLAN:   Lumbar strain Improving.  History and exam most  consistent with musculoskeletal etiology.  No concern for cauda equina or infectious etiology at this time given lack of neurological changes and infectious symptoms, respectively.  Dribbling after urinating in a sexually active male his age could be consistent with prostatitis; however, reproduction of pain on exam and history more concerning for musculoskeletal etiology.  I am reassured that his symptoms overall improved today, though will recommend ibuprofen 600 mg every 8 hours for anti-inflammatory effects as well as Flexeril 5 mg every 8 hours, both for a total of 5 days.  I advised him to contact me if this does not improve the pain, I will send in a PT referral at that time.  Genital herpes Experiences outbreaks nearly monthly.  Has not been taking 1 g twice daily for outbreaks.  Advised to take this dose when outbreak occurs, but would also like to observe recurrent lesion around rectum given history of copious bleeding.  Patient to make appointment at next outbreak for further evaluation; plan for labs at that time.   Robert Hal, MD [place]

## 2022-11-19 ENCOUNTER — Other Ambulatory Visit: Payer: Self-pay | Admitting: Family Medicine

## 2023-06-22 ENCOUNTER — Ambulatory Visit: Payer: 59 | Admitting: Family Medicine

## 2023-12-21 ENCOUNTER — Other Ambulatory Visit: Payer: Self-pay | Admitting: Family Medicine

## 2023-12-22 ENCOUNTER — Other Ambulatory Visit: Payer: Self-pay | Admitting: Family Medicine

## 2023-12-28 ENCOUNTER — Encounter: Payer: Self-pay | Admitting: Family Medicine

## 2023-12-30 NOTE — Telephone Encounter (Signed)
 [facility]   Pharmacy reports they never received Valacyclovir  prescription.   Will forward to PCP to resend prescription.
